# Patient Record
Sex: Male | Born: 2000 | Race: Black or African American | Hispanic: No | Marital: Single | Smoking: Never smoker
Health system: Southern US, Community
[De-identification: ages and names within clinical notes are randomized; demographics above are authoritative.]

## PROBLEM LIST (undated history)

## (undated) DIAGNOSIS — R569 Unspecified convulsions: Secondary | ICD-10-CM

## (undated) DIAGNOSIS — R55 Syncope and collapse: Secondary | ICD-10-CM

## (undated) HISTORY — DX: Syncope and collapse: R55

---
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
## 2022-03-10 ENCOUNTER — Ambulatory Visit (INDEPENDENT_AMBULATORY_CARE_PROVIDER_SITE_OTHER): Payer: Medicaid Other

## 2022-03-10 ENCOUNTER — Ambulatory Visit (HOSPITAL_COMMUNITY)
Admission: RE | Admit: 2022-03-10 | Discharge: 2022-03-10 | Disposition: A | Discharge status: Home / Self Care | Payer: Medicaid Other | Source: Ambulatory Visit | Attending: Emergency Medicine | Admitting: Emergency Medicine

## 2022-03-10 ENCOUNTER — Encounter (HOSPITAL_COMMUNITY): Payer: Self-pay

## 2022-03-10 VITALS — BP 119/72 | HR 76 | Temp 97.9°F | Resp 18

## 2022-03-10 DIAGNOSIS — M79672 Pain in left foot: Secondary | ICD-10-CM

## 2022-03-10 NOTE — ED Triage Notes (Signed)
Pt presents with left foot injury after someone stepped on it yesterday.

## 2022-03-10 NOTE — Discharge Instructions (Signed)
Your x-ray today did not show injury to the bone of your foot. Your pain is most likely being caused by irritation to the soft tissues, this should improve as time progresses.   Take ibuprofen 800 mg three times a day for 5 days to help reduce swelling and pain   You may apply heat or ice, whichever makes you feel better, to affected area in 15 minute intervals  You may continue activity as tolerated, there is no injury therefore, it is important that you continue to move around so you do not loose strength to the area  For the first 2-3 days you may wrap foot with ace wrap for additional support while completing activities, once wrapped if you begin to experience numbness or tingling it is too tight, remove and redo, you should be able to easily fit one finger under wrap   If symptoms persist past 2 weeks, you may follow up at urgent care or with orthopedic specialist for evaluation, an orthopedic doctor specializes in the bone, they may provide  management such as but not limited to imaging, long term medications and physical therapy

## 2022-03-10 NOTE — ED Provider Notes (Signed)
[facility]    CSN: [id number] Arrival date & time: 03/10/22  1022      History   Chief Complaint Chief Complaint  Patient presents with   Foot Injury    HPI Kenneth Contreras is a 21 y.o. male.   Patient presents with left foot pain and swelling for 1 day after someone stepped onto foot.  Painful to bear weight.  Has attempted to soak the foot in warm water which has been ineffective.  Denies ecchymosis, numbness, tingling, prior injury or trauma.     Past Medical History:  Diagnosis Date   Asthma    Near syncope    Seizure [facility])     Patient Active Problem List   Diagnosis Date Noted   Syncope and collapse 09/15/2021   Seizure (HCC) 05/10/2021   Contusion of bone 08/12/2014    History reviewed. No pertinent surgical history.     Home Medications    Prior to Admission medications   Medication Sig Start Date End Date Taking? Authorizing Provider  ACETAMINOPHEN 160 MG/5 ML PO SOLN Take 12 mLs by mouth 3 (three) times daily. 08/26/11   Domenick Gong, MD  albuterol (ACCUNEB) 1.25 MG/3ML nebulizer solution Take 1 ampule by nebulization every 6 (six) hours as needed. Patient not taking: Reported on 07/06/2021    [provider]  artificial tears (LACRILUBE) OINT ophthalmic ointment Place into the right eye at bedtime as needed for dry eyes. Patient not taking: Reported on 04/21/2020 11/14/18   Aviva Kluver B, PA-C  cetirizine (ZYRTEC) 10 MG tablet Take by mouth. Patient not taking: Reported on 07/06/2021    [provider]  ibuprofen (ADVIL,MOTRIN) 100 MG/5ML suspension Take 5 mg/kg by mouth every 6 (six) hours as needed.   Patient not taking: Reported on 07/06/2021    [provider]  ondansetron (ZOFRAN ODT) 4 MG disintegrating tablet Take 1 tablet (4 mg total) by mouth every 8 (eight) hours as needed for nausea or vomiting. Patient not taking: Reported on 07/06/2021 09/03/19   Lew Dawes, PA-C    Family History Family  History  Problem Relation Age of Onset   Asthma Mother     Social History Social History   Tobacco Use   Smoking status: Never    Passive exposure: Yes   Smokeless tobacco: Never  Vaping Use   Vaping Use: Never used  Substance Use Topics   Alcohol use: Never   Drug use: Never     Allergies   Patient has no known allergies.   Review of Systems Review of Systems  Constitutional: Negative.   Respiratory: Negative.    Cardiovascular: Negative.   Musculoskeletal:  Positive for gait problem and myalgias. Negative for arthralgias, back pain, joint swelling, neck pain and neck stiffness.    Physical Exam Triage Vital Signs ED Triage Vitals  Enc Vitals Group     BP 03/10/22 1051 119/72     Pulse Rate 03/10/22 1051 76     Resp 03/10/22 1051 18     Temp 03/10/22 1051 97.9 F (36.6 C)     Temp Source 03/10/22 1050 Oral     SpO2 03/10/22 1051 98 %     Weight --      Height --      Head Circumference --      Peak Flow --      Pain Score 03/10/22 1049 6     Pain Loc --      Pain Edu? --  Excl. in GC? --    No data found.  Updated Vital Signs BP 119/72 (BP Location: Right Arm)   Pulse 76   Temp 97.9 F (36.6 C) (Oral)   Resp 18   SpO2 98%   Visual Acuity Right Eye Distance:   Left Eye Distance:   Bilateral Distance:    Right Eye Near:   Left Eye Near:    Bilateral Near:     Physical Exam Constitutional:      Appearance: Normal appearance.  HENT:     Head: Normocephalic.  Eyes:     Extraocular Movements: Extraocular movements intact.  Pulmonary:     Effort: Pulmonary effort is normal.  Feet:     Comments: Tenderness amount of moderate swelling over the calcaneus and cuboid bone along the lateral aspect of the left foot, no ecchymosis or deformity noted, difficulty bearing weight, limping on exam exam room air, 2+ dorsalis pedis and pedal pulse, sensation intact Skin:    General: Skin is warm and dry.  Neurological:     Mental Status: He is alert  and oriented to person, place, and time. Mental status is at baseline.  Psychiatric:        Mood and Affect: Mood normal.        Behavior: Behavior normal.     UC Treatments / Results  Labs (all labs ordered are listed, but only abnormal results are displayed) Labs Reviewed - No data to display  EKG   Radiology No results found.  Procedures Procedures (including critical care time)  Medications Ordered in UC Medications - No data to display  Initial Impression / Assessment and Plan / UC Course  I have reviewed the triage vital signs and the nursing notes.  Pertinent labs & imaging results that were available during my care of the patient were reviewed by me and considered in my medical decision making (see chart for details).  Left foot pain  X-ray negative, discussed findings with patient, recommended ibuprofen 800 mg for 5 days to further reduce swelling and for comfort, may use Tylenol in addition, may use ice or heat over the affected area in 10 to 15-minute intervals, may use compression and elevation, may continue warm soaks if providing comfort, given walking referral to orthopedics if symptoms continue to persist Final Clinical Impressions(s) / UC Diagnoses   Final diagnoses:  None   Discharge Instructions   None    ED Prescriptions   None    PDMP not reviewed this encounter.   Valinda Hoar, NP 03/10/22 1139

## 2022-03-17 ENCOUNTER — Encounter: Payer: Self-pay | Admitting: Cardiovascular Disease

## 2022-03-17 NOTE — Progress Notes (Unsigned)
Cardiology Office Note:    Date:  03/17/2022   ID:  Kenneth Contreras, DOB 05/21/2001, MRN [id number]  PCP:  [facility]   [facility] Providers Cardiologist:  Kenneth Contreras     Referring MD: [facility]   Chief Complaint  Patient presents with   Loss of Consciousness    Dec. 7, 2022   Kenneth Contreras is a 21 y.o. male with a hx of near syncope.  Seen with mother,  Kenneth Contreras   We were asked to see him for further evaluation of his near syncope by Dr. Holly Contreras.  Symptoms started 2 years ago Had seizure like , passed out  Saw neurology  Happens when he with a injections, when he goes to the dentist  This last episode occurred while his mother was washing his hair  Occurs sporatically ,  never occurs regularly  Plays basketball occasionally   His grandfather as had a stroke, also has seizures Grandmother and aunt have CHF   March 18, 2022 Kenneth Contreras is seen today for follow up . Was seen 6 months ago   Has a hx of reflex syncope    Past Medical History:  Diagnosis Date   Asthma    Near syncope    Seizure [facility])     History reviewed. No pertinent surgical history.  Current Medications: No outpatient medications have been marked as taking for the 03/18/22 encounter (Office Visit) with Kenneth Contreras, Kenneth Ping, MD.     Allergies:   Patient has no known allergies.   Social History   Socioeconomic History   Marital status: Single    Spouse name: Not on file   Number of children: Not on file   Years of education: Not on file   Highest education level: Not on file  Occupational History   Not on file  Tobacco Use   Smoking status: Never    Passive exposure: Yes   Smokeless tobacco: Never  Vaping Use   Vaping Use: Never used  Substance and Sexual Activity   Alcohol use: Never   Drug use: Never   Sexual activity: Not Currently  Other Topics Concern   Not on file  Social History Narrative   Kenneth Contreras is a 12th grade student at [employer].    He works for the [employer]   Social Determinants of Corporate investment banker Strain: Not on [employer] Insecurity: Not on file  Transportation Needs: Not on file  Physical Activity: Not on file  Stress: Not on file  Social Connections: Not on file     Family History: The patient's family history includes Asthma in his mother.  ROS:   Please see the history of present illness.     All other systems reviewed and are negative.  EKGs/Labs/Other Studies Reviewed:    The following studies were reviewed today:   EKG:     Recent Labs: 05/10/2021: ALT 14; Magnesium 1.9; Platelets 222 07/03/2021: BUN 8; Creatinine, Ser 0.90; Hemoglobin 12.9; Potassium 3.6; Sodium 135  Recent Lipid Panel No results found for: "CHOL", "TRIG", "HDL", "CHOLHDL", "VLDL", "LDLCALC", "LDLDIRECT"   Risk Assessment/Calculations:           Physical Exam:    Physical Exam: There were no vitals taken for this visit.  GEN:  Well nourished, well developed in no acute distress HEENT: Normal NECK: No JVD; No carotid bruits LYMPHATICS: No lymphadenopathy CARDIAC: RRR ***, no murmurs, rubs, gallops RESPIRATORY:  Clear to auscultation without rales, wheezing or rhonchi  ABDOMEN: Soft, non-tender, non-distended MUSCULOSKELETAL:  No edema; No deformity  SKIN: Warm and dry NEUROLOGIC:  Alert and oriented x 3   ASSESSMENT:    No diagnosis found. PLAN:     Syncope :  symptoms are c/w " breath holding" - Bezold Jarish reflex  or Reflex syncope  .              Medication Adjustments/Labs and Tests Ordered: Current medicines are reviewed at length with the patient today.  Concerns regarding medicines are outlined above.  No orders of the defined types were placed in this encounter.   No orders of the defined types were placed in this encounter.    There are no Patient Instructions on file for this visit.   Signed, Kristeen Miss, MD  03/17/2022 8:54 PM    [facility]

## 2022-03-18 ENCOUNTER — Encounter: Payer: Medicaid Other | Admitting: Cardiovascular Disease

## 2022-03-23 ENCOUNTER — Encounter: Payer: Self-pay | Admitting: Cardiovascular Disease

## 2022-03-28 ENCOUNTER — Ambulatory Visit: Payer: Medicaid Other | Admitting: Cardiovascular Disease

## 2022-04-08 ENCOUNTER — Encounter: Payer: Self-pay | Admitting: Cardiovascular Disease

## 2022-04-08 ENCOUNTER — Ambulatory Visit (INDEPENDENT_AMBULATORY_CARE_PROVIDER_SITE_OTHER): Payer: Medicaid Other | Admitting: Cardiovascular Disease

## 2022-04-08 VITALS — BP 118/76 | HR 83 | Ht 67.0 in | Wt 128.0 lb

## 2022-04-08 DIAGNOSIS — R55 Syncope and collapse: Secondary | ICD-10-CM

## 2022-04-08 NOTE — Patient Instructions (Signed)
Medication Instructions:  Your physician recommends that you continue on your current medications as directed. Please refer to the Current Medication list given to you today.  *If you need a refill on your cardiac medications before your next appointment, please call your pharmacy*   Lab Work: NONE If you have labs (blood work) drawn today and your tests are completely normal, you will receive your results only by: MyChart Message (if you have MyChart) OR A paper copy in the mail If you have any lab test that is abnormal or we need to change your treatment, we will call you to review the results.   Testing/Procedures: NONE   Follow-Up: At [facility], you and your health needs are our priority.  As part of our continuing mission to provide you with exceptional heart care, we have created designated Provider Care Teams.  These Care Teams include your primary Cardiologist (physician) and Advanced Practice Providers (APPs -  Physician Assistants and Nurse Practitioners) who all work together to provide you with the care you need, when you need it.  Your next appointment:   As Needed  The format for your next appointment:   In Person  Provider:   Philip Nahser, MD    Important Information About Sugar       

## 2022-04-08 NOTE — Progress Notes (Signed)
Cardiology Office Note:    Date:  04/08/2022   ID:  Illene Contreras, DOB 2000/11/02, MRN [id number]  PCP:  [facility]   [facility] Providers Cardiologist:  Alieu Finnigan     Referring MD: [facility]   Chief Complaint  Patient presents with   reflux syncope    Dec. 7, 2022   Kenneth Contreras is a 21 y.o. male with a hx of near syncope.  Seen with mother,  [name]   We were asked to see him for further evaluation of his near syncope by Dr. Holly Bodily.  Symptoms started 2 years ago Had seizure like , passed out  Saw neurology  Happens when he with a injections, when he goes to the dentist  This last episode occurred while his mother was washing his hair  Occurs sporatically ,  never occurs regularly  Plays basketball occasionally   His grandfather as had a stroke, also has seizures Grandmother and aunt have CHF  April 08, 2022:  Kenneth Contreras is seen today for follow-up of his episodes of reflex syncope.  He is seen with his mother, [name]   Has graduated . Works at [employer]. 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  Right ventricle is normal.  He has trivial mitral regurgitation.  Zio patch monitor revealed no significant arrhythmias to explain his syncope  Past Medical History:  Diagnosis Date   Asthma    Near syncope    Seizure [facility])     History reviewed. No pertinent surgical history.  Current Medications: Current Meds  Medication Sig   ACETAMINOPHEN 160 MG/5 ML PO SOLN Take 12 mLs by mouth 3 (three) times daily.   albuterol (ACCUNEB) 1.25 MG/3ML nebulizer solution Take 1 ampule by nebulization every 6 (six) hours as needed.   artificial tears (LACRILUBE) OINT ophthalmic ointment Place into the right eye at bedtime as needed for dry eyes.   cetirizine (ZYRTEC) 10 MG tablet Take by mouth.   ibuprofen (ADVIL,MOTRIN) 100 MG/5ML suspension Take 5 mg/kg by mouth  every 6 (six) hours as needed.   ondansetron (ZOFRAN ODT) 4 MG disintegrating tablet Take 1 tablet (4 mg total) by mouth every 8 (eight) hours as needed for nausea or vomiting.     Allergies:   Patient has no known allergies.   Social History   Socioeconomic History   Marital status: Single    Spouse name: Not on file   Number of children: Not on file   Years of education: Not on file   Highest education level: Not on file  Occupational History   Not on file  Tobacco Use   Smoking status: Never    Passive exposure: Yes   Smokeless tobacco: Never  Vaping Use   Vaping Use: Never used  Substance and Sexual Activity   Alcohol use: Never   Drug use: Never   Sexual activity: Not Currently  Other Topics Concern   Not on file  Social History Narrative   Kenneth Contreras is a 12th grade student at [employer].    He works for the [employer]   Social Determinants of Corporate investment banker Strain: Not on [employer] Insecurity: Not on file  Transportation Needs: Not on file  Physical Activity: Not on file  Stress: Not on file  Social Connections: Not on file     Family History: The patient's family history  includes Asthma in his mother.  ROS:   Please see the history of present illness.     All other systems reviewed and are negative.  EKGs/Labs/Other Studies Reviewed:    The following studies were reviewed today:  EKG:       Recent Labs: 05/10/2021: ALT 14; Magnesium 1.9; Platelets 222 07/03/2021: BUN 8; Creatinine, Ser 0.90; Hemoglobin 12.9; Potassium 3.6; Sodium 135  Recent Lipid Panel No results found for: "CHOL", "TRIG", "HDL", "CHOLHDL", "VLDL", "LDLCALC", "LDLDIRECT"   Risk Assessment/Calculations:           Physical Exam:    Physical Exam: Blood pressure 118/76, pulse 83, height 5\' 7"  (1.702 m), weight 128 lb (58.1 kg), SpO2 99 %.  GEN:  Well nourished, well developed in no acute distress HEENT: Normal NECK: No JVD; No carotid  bruits LYMPHATICS: No lymphadenopathy CARDIAC: RRR , no murmurs, rubs, gallops RESPIRATORY:  Clear to auscultation without rales, wheezing or rhonchi  ABDOMEN: Soft, non-tender, non-distended MUSCULOSKELETAL:  No edema; No deformity  SKIN: Warm and dry NEUROLOGIC:  Alert and oriented x 3   ASSESSMENT:    1. Syncope, unspecified syncope type    PLAN:     Reflex syncope:   also called a common fainting spell.   No cardiac issues  have been identified.    Echo shows normal LV function.  He has mild mitral regurgitation.  No issues.  Event monitor revealed no significant cardiac arrhythmias.    We will see him on an as-needed basis             Medication Adjustments/Labs and Tests Ordered: Current medicines are reviewed at length with the patient today.  Concerns regarding medicines are outlined above.  No orders of the defined types were placed in this encounter.   No orders of the defined types were placed in this encounter.    Patient Instructions  Medication Instructions:  Your physician recommends that you continue on your current medications as directed. Please refer to the Current Medication list given to you today. *If you need a refill on your cardiac medications before your next appointment, please call your pharmacy*  Lab Work: NONE If you have labs (blood work) drawn today and your tests are completely normal, you will receive your results only by: MyChart Message (if you have MyChart) OR A paper copy in the mail If you have any lab test that is abnormal or we need to change your treatment, we will call you to review the results.  Testing/Procedures: NONE  Follow-Up: At [facility], you and your health needs are our priority.  As part of our continuing mission to provide you with exceptional heart care, we have created designated Provider Care Teams.  These Care Teams include your primary Cardiologist (physician) and Advanced Practice Providers  (APPs -  Physician Assistants and Nurse Practitioners) who all work together to provide you with the care you need, when you need it.  Your next appointment:   As Needed  The format for your next appointment:   In Person  Provider:   [facility], MD {  Important Information About Sugar         Signed, Kristeen Miss, MD  04/08/2022 4:24 PM    [facility]

## 2022-11-02 ENCOUNTER — Telehealth: Payer: Self-pay | Admitting: Cardiovascular Disease

## 2022-11-02 NOTE — Telephone Encounter (Signed)
Pt's mother is calling to see if they can get an updated letter explaining patient's last diagnosis of syncope. She states that patient has jury duty coming up and believes the patient will have a hard time sitting through this. Requesting return call.

## 2022-11-03 NOTE — Telephone Encounter (Signed)
Patient's mother calling back. She says she is concerned for the patient's anxiety and syncope. She says it would be a lot for him to do jury duty.

## 2022-11-04 ENCOUNTER — Encounter: Payer: Self-pay | Admitting: Cardiovascular Disease

## 2022-11-04 NOTE — Telephone Encounter (Signed)
Called and spoke with mother to inform her that we can write a letter stating pt has reflex syncope, but that was it. If he's able to work, go to school, drive or participate in normal activities, there's no reason he cannot participate in jury duty from a cardiac standpoint. Mother asks that we send a letter to him stating the diagnosis of reflex syncope. Letter sent via [employer].

## 2023-08-28 ENCOUNTER — Institutional Professional Consult (permissible substitution): Payer: Medicaid Other | Admitting: Nurse Practitioner

## 2023-09-28 ENCOUNTER — Ambulatory Visit (INDEPENDENT_AMBULATORY_CARE_PROVIDER_SITE_OTHER): Payer: Medicaid Other | Admitting: Nurse Practitioner

## 2023-09-28 ENCOUNTER — Encounter: Payer: Self-pay | Admitting: Nurse Practitioner

## 2023-09-28 VITALS — BP 120/70 | HR 67 | Wt 127.6 lb

## 2023-09-28 DIAGNOSIS — Z0000 Encounter for general adult medical examination without abnormal findings: Secondary | ICD-10-CM | POA: Insufficient documentation

## 2023-09-28 DIAGNOSIS — R569 Unspecified convulsions: Secondary | ICD-10-CM

## 2023-09-28 DIAGNOSIS — J4520 Mild intermittent asthma, uncomplicated: Secondary | ICD-10-CM | POA: Diagnosis not present

## 2023-09-28 DIAGNOSIS — G43409 Hemiplegic migraine, not intractable, without status migrainosus: Secondary | ICD-10-CM | POA: Diagnosis not present

## 2023-09-28 DIAGNOSIS — J3089 Other allergic rhinitis: Secondary | ICD-10-CM | POA: Insufficient documentation

## 2023-09-28 MED ORDER — CETIRIZINE HCL 10 MG PO TABS: 10.0000 mg | ORAL_TABLET | Freq: Every day | ORAL | 3 refills | Status: AC

## 2023-09-28 MED ORDER — ALBUTEROL SULFATE (2.5 MG/3ML) 0.083% IN NEBU: 2.5000 mg | INHALATION_SOLUTION | Freq: Four times a day (QID) | RESPIRATORY_TRACT | 1 refills | Status: AC | PRN

## 2023-09-28 MED ORDER — RIZATRIPTAN BENZOATE 10 MG PO TABS: 10.0000 mg | ORAL_TABLET | ORAL | 6 refills | Status: DC | PRN

## 2023-09-28 NOTE — Progress Notes (Unsigned)
Tollie Eth, DNP, AGNP-c Primary Care & Sports Medicine [address] [place], [place] [address] Main Office [phone]   New patient visit   Patient: Kenneth Contreras   DOB: 11-22-2000   22 y.o. Male  MRN: [id number] Visit Date: 09/28/2023  Patient Care Team: Tollie Eth, NP as PCP - General (Nurse Practitioner)  Today's Vitals   09/28/23 0927  BP: 120/70  Pulse: 67  SpO2: 98%  Weight: 127 lb 9.6 oz (57.9 kg)   Body mass index is 19.98 kg/m.   Today's healthcare provider: Tollie Eth, NP   Chief Complaint  Patient presents with  . new pt get established    New pt get established. Having headaches 1-2 days a week. Taking OTC tylenlol but it doesn't work   Subjective    Kenneth Contreras is a 22 y.o. male who presents today as a new patient to establish care.      History reviewed and reveals the following: Past Medical History:  Diagnosis Date  . Asthma   . Near syncope   . Seizure (HCC)    No past surgical history on file. Family Status  Relation Name Status  . Mother  Alive  . Father  Alive  . MGM  Alive  . MGF  Alive  . PGM  Alive  . PGF  Alive  No partnership data on file   Family History  Problem Relation Age of Onset  . Asthma Mother    Social History   Socioeconomic History  . Marital status: Single    Spouse name: Not on file  . Number of children: Not on file  . Years of education: Not on file  . Highest education level: Not on file  Occupational History  . Not on file  Tobacco Use  . Smoking status: Never    Passive exposure: Yes  . Smokeless tobacco: Never  Vaping Use  . Vaping status: Never Used  Substance and Sexual Activity  . Alcohol use: Never  . Drug use: Never  . Sexual activity: Not Currently  Other Topics Concern  . Not on file  Social History Narrative   Maurico is a 12th grade student at [employer].    He works for the [employer]   Social Drivers of [employer]: Not on File (01/27/2022)   Received from [employer], [employer]   . Financial Resource Strain: 0  Food Insecurity: Not on File (07/06/2023)   Received from [employer]   . Food: 0  Transportation Needs: Not on File (01/27/2022)   Received from [facility], [employer]   . Transportation: 0  Physical Activity: Not on File (01/27/2022)   Received from [place], [place]   Physical Activity   . Physical Activity: 0  Stress: Not on File (01/27/2022)   Received from [place], [place]   Stress   . Stress: 0  Social Connections: Not on File (06/24/2023)   Received from [employer]   . Connectedness: 0   Outpatient Medications Prior to Visit  Medication Sig  . albuterol (ACCUNEB) 1.25 MG/3ML nebulizer solution Take 1 ampule by nebulization every 6 (six) hours as needed.  . cetirizine (ZYRTEC) 10 MG tablet Take by mouth.  [name] ibuprofen (ADVIL,MOTRIN) 100 MG/5ML suspension Take 5 mg/kg by mouth every 6 (six) hours as needed.  . ondansetron (ZOFRAN ODT) 4 MG disintegrating tablet Take 1  tablet (4 mg total) by mouth every 8 (eight) hours as needed for nausea or vomiting.  . [DISCONTINUED] ACETAMINOPHEN 160 MG/5 ML PO SOLN Take 12 mLs by mouth 3 (three) times daily.  . [DISCONTINUED] artificial tears (LACRILUBE) OINT ophthalmic ointment Place into the right eye at bedtime as needed for dry eyes.   No facility-administered medications prior to visit.   No Known Allergies  There is no immunization history on file for this patient.  Health Maintenance Due Health Maintenance Topics with due status: Overdue     Topic Date Due   HPV VACCINES Never done   HIV Screening Never done   Hepatitis C Screening Never done   DTaP/Tdap/Td Never done   INFLUENZA VACCINE 05/11/2023   COVID-19 Vaccine Never done    Review of Systems All review of systems negative except what is listed in the HPI   Objective    BP 120/70   Pulse 67   Wt 127 lb 9.6  oz (57.9 kg)   SpO2 98%   BMI 19.98 kg/m  Physical Exam Vitals reviewed.  Constitutional:      General: He is not in acute distress.    Appearance: Normal appearance. He is not diaphoretic.  HENT:     Head: Normocephalic and atraumatic.  Eyes:     General: No scleral icterus.    Conjunctiva/sclera: Conjunctivae normal.  Cardiovascular:     Rate and Rhythm: Normal rate and regular rhythm.     Pulses: Normal pulses.     Heart sounds: Normal heart sounds. No murmur heard. Pulmonary:     Effort: Pulmonary effort is normal. No respiratory distress.     Breath sounds: Normal breath sounds. No wheezing or rhonchi.  Abdominal:     General: Bowel sounds are normal. There is no distension.     Palpations: Abdomen is soft.     Tenderness: There is no abdominal tenderness. There is no guarding.  Musculoskeletal:        General: Normal range of motion.     Right lower leg: No edema.     Left lower leg: No edema.  Skin:    General: Skin is warm and dry.     Capillary Refill: Capillary refill takes less than 2 seconds.     Findings: No rash.  Neurological:     Mental Status: He is alert and oriented to person, place, and time. Mental status is at baseline.  Psychiatric:        Mood and Affect: Mood normal.        Behavior: Behavior normal.     No results found for any visits on 09/28/23.  Assessment & Plan      Problem List Items Addressed This Visit   None    No follow-ups on file.      Noah Lembke, Sung Amabile, NP, DNP, AGNP-C [facility]

## 2023-09-28 NOTE — Patient Instructions (Signed)

## 2023-10-10 ENCOUNTER — Encounter: Payer: Self-pay | Admitting: Nurse Practitioner

## 2023-10-10 NOTE — Assessment & Plan Note (Signed)
 Intermittent headaches 1-2 times per week, unresponsive to acetaminophen . Symptoms include photophobia and periorbital pain without nausea or vomiting. Likely migraines based on presentation and typical triggers. Discussed rizatriptan  for acute relief, emphasizing Leslee Suire administration. If ineffective after two hours, a second dose can be taken. Alternative treatments available if needed. - Prescribe rizatriptan  - Educate on Laurence Folz administration at symptom onset - Advise rest in a quiet, dark space during headaches - Provide migraine management information

## 2023-10-10 NOTE — Assessment & Plan Note (Signed)
 Review of current and past medical history, social history, medication, and family history.  Review of care gaps and health maintenance recommendations.  Records from recent providers to be requested if not available in Chart Review or Care Everywhere.  Recommendations for health maintenance, diet, and exercise provided.  Discussion of acute needs, labs, and medication refills provided based on patient needs and requests.  Follow-up recommended in 1years for CPE.

## 2023-10-10 NOTE — Assessment & Plan Note (Signed)
 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 Described as adrenaline-triggered events. No current medication. Discussed avoiding known triggers and managing anxiety. - Monitor for new seizure activity - Educate on avoiding triggers and managing anxiety

## 2023-10-10 NOTE — Assessment & Plan Note (Signed)
Asthma managed with as-needed nebulizer. No recent exacerbations. Discussed inhaler use for shortness of breath or wheezing. - Prescribe inhaler for shortness of breath or wheezing

## 2023-11-10 NOTE — Progress Notes (Deleted)
 Vaccines: pneumonia and covid

## 2023-11-14 ENCOUNTER — Encounter: Payer: Medicaid Other | Admitting: Nurse Practitioner

## 2023-12-22 ENCOUNTER — Encounter: Payer: Medicaid Other | Admitting: Nurse Practitioner

## 2024-04-01 ENCOUNTER — Encounter: Admitting: Nurse Practitioner

## 2024-04-19 ENCOUNTER — Ambulatory Visit: Payer: Self-pay

## 2024-04-19 NOTE — Telephone Encounter (Signed)
 FYI Only or Action Required?: Action required by provider: request for appointment.  Patient was last seen in primary care on 09/28/2023 by Early, Camie BRAVO, NP.  Called Nurse Triage reporting Back pain.  Symptoms began several months ago.  Interventions attempted: OTC medications: Tylenol .  Symptoms are: unchanged.Pt. Has on going low back pain. No radiation of pain. No known injury.  Triage Disposition: See PCP When Office is Open (Within 3 Days)  Patient/caregiver understands and will follow disposition?: Yes      Copied from CRM [id number]. Topic: Clinical - Red Word Triage >> Apr 19, 2024 10:20 AM Antwanette L wrote: Red Word that prompted transfer to Nurse Triage: patient is experiencing sharp back pain. Possible the patient could have lumbar scoliosis. The patient mother (sada) has it. Reason for Disposition  [1] MODERATE back pain (e.g., interferes with normal activities) AND [2] present > 3 days  Answer Assessment - Initial Assessment Questions 1. ONSET: When did the pain begin? (e.g., minutes, hours, days)     For awhile 2. LOCATION: Where does it hurt? (upper, mid or lower back)     Lower 3. SEVERITY: How bad is the pain?  (e.g., Scale 1-10; mild, moderate, or severe)     Moderate 4. PATTERN: Is the pain constant? (e.g., yes, no; constant, intermittent)      Comes and goes 5. RADIATION: Does the pain shoot into your legs or somewhere else?     no 6. CAUSE:  What do you think is causing the back pain?      unsure 7. BACK OVERUSE:  Any recent lifting of heavy objects, strenuous work or exercise?     no 8. MEDICINES: What have you taken so far for the pain? (e.g., nothing, acetaminophen , NSAIDS)     Tylenol  9. NEUROLOGIC SYMPTOMS: Do you have any weakness, numbness, or problems with bowel/bladder control?     no 10. OTHER SYMPTOMS: Do you have any other symptoms? (e.g., fever, abdomen pain, burning with urination, blood in urine)       no 11.  PREGNANCY: Is there any chance you are pregnant? When was your last menstrual period?       N/a  Protocols used: Back Pain-A-AH

## 2024-04-23 ENCOUNTER — Ambulatory Visit: Admitting: Medical

## 2024-04-29 ENCOUNTER — Ambulatory Visit: Admitting: Medical

## 2024-07-11 ENCOUNTER — Ambulatory Visit: Admitting: Nurse Practitioner

## 2024-07-11 ENCOUNTER — Encounter: Payer: Self-pay | Admitting: Nurse Practitioner

## 2024-07-11 VITALS — BP 112/72 | HR 80 | Wt 122.0 lb

## 2024-07-11 DIAGNOSIS — M545 Low back pain, unspecified: Secondary | ICD-10-CM | POA: Diagnosis not present

## 2024-07-11 DIAGNOSIS — G43409 Hemiplegic migraine, not intractable, without status migrainosus: Secondary | ICD-10-CM

## 2024-07-11 MED ORDER — TIZANIDINE HCL 4 MG PO TABS: 4.0000 mg | ORAL_TABLET | Freq: Every day | ORAL | 2 refills | Status: AC | PRN

## 2024-07-11 MED ORDER — RIZATRIPTAN BENZOATE 10 MG PO TABS: 10.0000 mg | ORAL_TABLET | ORAL | 6 refills | Status: AC | PRN

## 2024-07-11 MED ORDER — MELOXICAM 15 MG PO TABS: 15.0000 mg | ORAL_TABLET | Freq: Every day | ORAL | 2 refills | Status: AC

## 2024-07-11 NOTE — Patient Instructions (Signed)
 I have sent in Meloxicam to help with inflammation and pain. I would like you to take this once a day to help with the symptoms of back pain. Don't take ibuprofen  with this medication.  I have also sent in Skelaxin. This is a muscle relaxer. You can use this if the muscles are tight and you are unable to get them to relax and stop hurting. You can take this up to once a day. It may make you tired, so be cautious when you use this.   Heat applied to the back with a heating pad or similar can be very helpful to reduce pain when it comes on suddenly. Applying heat for 20 minutes can be very effective.   I have included stretches on a handout for you. I would like you to work on these for the next 4 weeks to see if this helps.   The x-ray was sent to [facility] at  [address] You can have this done immediately, or you can wait to see if the month of medication and stretching helps.   If you are not better in a month, please send us  a MyChart message and let me know and we will work to get you seen by the sports medicine doctor in the office.

## 2024-07-11 NOTE — Progress Notes (Signed)
 Catheline Doing, DNP, AGNP-c [facility]  [address] [place], [place] [address] [phone]  ESTABLISHED PATIENT- Chronic Health and/or Follow-Up Visit on 07/11/2024  Blood pressure 112/72, pulse 80, weight 122 lb (55.3 kg).   HPI:  History of Present Illness Kenneth Contreras is a 23 year old male who presents with chronic lower back pain.  He has experienced lower back pain since childhood, primarily located in the middle of the lower back, occasionally extending slightly higher. The pain is described as a sharp, tight sensation in the muscles, typically occurring without a specific trigger, often starting at night, and lasting for about 15 to 20 minutes if untreated. Laying down and watching TV alleviates the pain, while walking can exacerbate it slightly. Lifting, pulling, or pushing does not seem to trigger the pain if it is not already present. The pain occurs approximately two days a week.  He has not undergone any imaging or x-rays for his back pain in the past. No radiation of the pain down his legs, weakness in his legs, or changes in bowel or bladder habits. He has previously used Tylenol  and muscle relaxers provided by his mother, which have helped ease the pain.  His mother has a history of scoliosis. He works at [employer], where he engages in physical activities such as lifting and moving items, which he considers a form of exercise.  All ROS negative with exception of what is listed above.    PHYSICAL EXAM Physical Exam Constitutional:      General: He is not in acute distress.    Appearance: Normal appearance. He is normal weight.  Eyes:     Pupils: Pupils are equal, round, and reactive to light.  Musculoskeletal:        General: Normal range of motion.     Cervical back: Normal.     Thoracic back: Normal. Normal range of motion. No scoliosis.     Lumbar back: No swelling, deformity, signs of trauma, spasms, tenderness or bony tenderness. Normal  range of motion. Negative right straight leg raise test and negative left straight leg raise test. No scoliosis.     Right lower leg: No edema.     Left lower leg: No edema.  Skin:    General: Skin is warm and dry.     Capillary Refill: Capillary refill takes less than 2 seconds.  Neurological:     Mental Status: He is alert and oriented to person, place, and time.     Sensory: No sensory deficit.     Motor: No weakness.     Coordination: Coordination normal.     Gait: Gait normal.     Deep Tendon Reflexes: Reflexes normal.  Psychiatric:        Mood and Affect: Mood normal.     PLAN Problem List Items Addressed This Visit     Hemiplegic migraine without status migrainosus, not intractable   Rizatriptan  effective for management of symptoms. Refills provided.       Relevant Medications   rizatriptan  (MAXALT ) 10 MG tablet   meloxicam (MOBIC) 15 MG tablet   tiZANidine (ZANAFLEX) 4 MG tablet   Lumbar pain - Primary   Chronic low back pain since childhood, located in the lower back, often occurring at night and exacerbated by being active, but no specific activity. Pain is sharp and muscle-related, lasting 15-20 minutes. Typical resolution with tylenol  and muscle relaxer. Differential includes arthritis or scoliosis, given family history of scoliosis. He works in a physically demanding  job which may contribute to symptoms. - Order x-ray of the lower back to rule out arthritis or scoliosis. - Prescribe meloxicam to be taken once daily for 30 days to reduce inflammation and pain. - Prescribe tizanidine as a muscle relaxant, to be used if meloxicam is insufficient, noting it may cause sedation. - Provide handout with stretches to be done once or twice daily to alleviate muscle tightness. - Advise on the option to delay x-ray for four weeks to assess the effectiveness of the current treatment plan if he wishes. - If no improvement in symptoms after 30 days of conservative treatment,  recommend f/u with Dr. Vita      Relevant Medications   meloxicam (MOBIC) 15 MG tablet   tiZANidine (ZANAFLEX) 4 MG tablet   Other Relevant Orders   DG Lumbar Spine Complete       Return if symptoms worsen or fail to improve.  Catheline Doing, DNP, AGNP-c

## 2024-07-11 NOTE — Assessment & Plan Note (Signed)
 Chronic low back pain since childhood, located in the lower back, often occurring at night and exacerbated by being active, but no specific activity. Pain is sharp and muscle-related, lasting 15-20 minutes. Typical resolution with tylenol  and muscle relaxer. Differential includes arthritis or scoliosis, given family history of scoliosis. He works in a physically demanding job which may contribute to symptoms. - Order x-ray of the lower back to rule out arthritis or scoliosis. - Prescribe meloxicam to be taken once daily for 30 days to reduce inflammation and pain. - Prescribe tizanidine as a muscle relaxant, to be used if meloxicam is insufficient, noting it may cause sedation. - Provide handout with stretches to be done once or twice daily to alleviate muscle tightness. - Advise on the option to delay x-ray for four weeks to assess the effectiveness of the current treatment plan if he wishes. - If no improvement in symptoms after 30 days of conservative treatment, recommend f/u with Dr. Vita

## 2024-07-11 NOTE — Assessment & Plan Note (Signed)
 Rizatriptan  effective for management of symptoms. Refills provided.

## 2024-11-05 ENCOUNTER — Encounter: Payer: Self-pay | Admitting: Nurse Practitioner

## 2024-11-27 ENCOUNTER — Encounter: Admitting: Nurse Practitioner
